# Patient Record
Sex: Male | Born: 1995 | Race: White | Hispanic: No | Marital: Single | Smoking: Current every day smoker
Health system: Southern US, Community
[De-identification: ages and names within clinical notes are randomized; demographics above are authoritative.]

---

## 2017-10-17 ENCOUNTER — Emergency Department (HOSPITAL_BASED_OUTPATIENT_CLINIC_OR_DEPARTMENT_OTHER): Payer: Self-pay

## 2017-10-17 ENCOUNTER — Encounter (HOSPITAL_BASED_OUTPATIENT_CLINIC_OR_DEPARTMENT_OTHER): Payer: Self-pay | Admitting: Emergency Medicine

## 2017-10-17 ENCOUNTER — Emergency Department (HOSPITAL_BASED_OUTPATIENT_CLINIC_OR_DEPARTMENT_OTHER)
Admission: EM | Admit: 2017-10-17 | Discharge: 2017-10-17 | Disposition: A | Discharge status: Home / Self Care | Payer: Self-pay | Attending: Emergency Medicine | Admitting: Emergency Medicine

## 2017-10-17 DIAGNOSIS — F17210 Nicotine dependence, cigarettes, uncomplicated: Secondary | ICD-10-CM | POA: Insufficient documentation

## 2017-10-17 DIAGNOSIS — L0201 Cutaneous abscess of face: Secondary | ICD-10-CM | POA: Insufficient documentation

## 2017-10-17 DIAGNOSIS — L0291 Cutaneous abscess, unspecified: Secondary | ICD-10-CM

## 2017-10-17 LAB — COMPREHENSIVE METABOLIC PANEL
ALK PHOS: 90 U/L (ref 38–126)
ALT: 47 U/L (ref 17–63)
AST: 29 U/L (ref 15–41)
Albumin: 3.7 g/dL (ref 3.5–5.0)
Anion gap: 7 (ref 5–15)
BILIRUBIN TOTAL: 0.6 mg/dL (ref 0.3–1.2)
BUN: 6 mg/dL (ref 6–20)
CALCIUM: 9 mg/dL (ref 8.9–10.3)
CO2: 30 mmol/L (ref 22–32)
Chloride: 100 mmol/L — ABNORMAL LOW (ref 101–111)
Creatinine, Ser: 1.08 mg/dL (ref 0.61–1.24)
GFR calc non Af Amer: >60 mL/min (ref >60)
Glucose, Bld: 96 mg/dL (ref 65–99)
POTASSIUM: 4 mmol/L (ref 3.5–5.1)
SODIUM: 137 mmol/L (ref 135–145)
TOTAL PROTEIN: 8 g/dL (ref 6.5–8.1)

## 2017-10-17 LAB — CBC WITH DIFFERENTIAL/PLATELET
BASOS ABS: 0 10*3/uL (ref 0.0–0.1)
BASOS PCT: 0 %
Eosinophils Absolute: 0.3 10*3/uL (ref 0.0–0.7)
Eosinophils Relative: 2 %
HCT: 44.8 % (ref 39.0–52.0)
HEMOGLOBIN: 14.9 g/dL (ref 13.0–17.0)
LYMPHS PCT: 18 %
Lymphs Abs: 2.8 10*3/uL (ref 0.7–4.0)
MCH: 31.6 pg (ref 26.0–34.0)
MCHC: 33.3 g/dL (ref 30.0–36.0)
MCV: 94.9 fL (ref 78.0–100.0)
Monocytes Absolute: 1.6 10*3/uL — ABNORMAL HIGH (ref 0.1–1.0)
Monocytes Relative: 10 %
NEUTROS ABS: 10.9 10*3/uL — AB (ref 1.7–7.7)
NEUTROS PCT: 70 %
Platelets: 345 10*3/uL (ref 150–400)
RBC: 4.72 MIL/uL (ref 4.22–5.81)
RDW: 12.9 % (ref 11.5–15.5)
WBC: 15.6 10*3/uL — ABNORMAL HIGH (ref 4.0–10.5)

## 2017-10-17 LAB — URINALYSIS, ROUTINE W REFLEX MICROSCOPIC
Bilirubin Urine: NEGATIVE
Glucose, UA: NEGATIVE mg/dL
Hgb urine dipstick: NEGATIVE
KETONES UR: NEGATIVE mg/dL
LEUKOCYTES UA: NEGATIVE
NITRITE: NEGATIVE
PH: 6 (ref 5.0–8.0)
Protein, ur: NEGATIVE mg/dL
SPECIFIC GRAVITY, URINE: 1.025 (ref 1.005–1.030)

## 2017-10-17 LAB — I-STAT CG4 LACTIC ACID, ED: Lactic Acid, Venous: 1.15 mmol/L (ref 0.5–1.9)

## 2017-10-17 MED ORDER — IBUPROFEN 800 MG PO TABS
800.0000 mg | ORAL_TABLET | Freq: Once | ORAL | Status: AC
  Administered 2017-10-17: 800 mg via ORAL
  Filled 2017-10-17: qty 2

## 2017-10-17 MED ORDER — LIDOCAINE-EPINEPHRINE (PF) 2 %-1:200000 IJ SOLN
10.0000 mL | Freq: Once | INTRAMUSCULAR | Status: AC
  Administered 2017-10-17: 10 mL via INTRADERMAL
  Filled 2017-10-17: qty 10

## 2017-10-17 MED ORDER — SULFAMETHOXAZOLE-TRIMETHOPRIM 800-160 MG PO TABS: 1.0000 | ORAL_TABLET | Freq: Two times a day (BID) | ORAL | 0 refills | Status: AC

## 2017-10-17 NOTE — ED Provider Notes (Signed)
[facility] EMERGENCY DEPARTMENT Provider Note   CSN: [id number] Arrival date & time: 10/17/17  1547     History   Chief Complaint Chief Complaint  Patient presents with  . Recurrent Skin Infections    HPI Zachary Pope is a 21 y.o. male.  HPI   [name] is a 21 year old male with a history of IV heroin use who presents to the emergency department with left forehead abscess and eye swelling.  Patient states that he noticed what appeared to be a pimple on the left forehead yesterday morning.  He picked at it and states that throughout the day it became increasingly red, swollen and tender.  This morning he woke up with left eye swelling making it difficult to open the eye, worsening throughout the day.  He states that pain in his forehead is 11/10 in severity, "throbbing and pulsating" and constant.  It is worsened with palpation.  He states that he tried cutting it open yesterday with what he states was a clean scalpel with some purulent drainage expressed.  States that he is taking ibuprofen and Benadryl to help with his symptoms.  He has also applied warm packs to the eye without significant relief.  Denies pain over the eyelid.  He also states that he has 2 abscesses on the right lower leg.  These developed over the past 3 days.  States that he used a scalpel to open one of them and was able to express significant purulent drainage.  He denies fever, nausea/vomiting, visual disturbance, pain with moving the eye, headache, ear pain, neck pain, numbness/weakness.  States that he has never had problems with abscesses in the past.  States that he has used heroin since age 56 and gets clean needles from the needle exchange program weekly.   History reviewed. No pertinent past medical history.  There are no active problems to display for this patient.   History reviewed. No pertinent surgical history.     Home Medications    Prior to Admission medications   Medication  Sig Start Date End Date Taking? Authorizing Provider  sulfamethoxazole-trimethoprim (BACTRIM DS,SEPTRA DS) 800-160 MG tablet Take 1 tablet by mouth 2 (two) times daily. 10/17/17   Kellie Shropshire, PA-C    Family History No family history on file.  Social History Social History  Substance Use Topics  . Smoking status: Current Every Day Smoker  . Smokeless tobacco: Never Used  . Alcohol use Yes     Allergies   Patient has no known allergies.   Review of Systems Review of Systems  Constitutional: Negative for chills, fatigue and fever.  HENT: Positive for facial swelling. Negative for sinus pain and sinus pressure.   Eyes: Negative for pain, discharge and visual disturbance.  Respiratory: Negative for shortness of breath.   Cardiovascular: Negative for chest pain.  Gastrointestinal: Negative for abdominal pain, diarrhea, nausea and vomiting.  Genitourinary: Negative for difficulty urinating.  Musculoskeletal: Negative for back pain.  Skin: Positive for color change (red and swollen left forehead ).  Neurological: Negative for dizziness, weakness, light-headedness, numbness and headaches.  Psychiatric/Behavioral: Negative for agitation.     Physical Exam Updated Vital Signs BP (!) 128/91   Pulse 85   Temp 98.5 F (36.9 C) (Oral)   Resp 19   Ht 5\' 9"  (1.753 m)   Wt 68 kg (150 lb)   SpO2 100%   BMI 22.15 kg/m   Physical Exam  Constitutional: He is oriented to person, place,  and time. He appears well-developed and well-nourished. No distress.  HENT:  Head: Normocephalic and atraumatic.  Mouth/Throat: Oropharynx is clear and moist. No oropharyngeal exudate.  Approximately 2 cm raised area of erythema, swelling and tenderness on the left forehead.  There is mild purulent drainage leaking.  It is tender to palpation.  Area of induration extending to the left eyebrow which is tender  Significant eyelid swelling of the left eye.  No tenderness of the eyelid to palpation.   See picture below.  Eyes: Pupils are equal, round, and reactive to light. Conjunctivae and EOM are normal. Right eye exhibits no discharge. Left eye exhibits no discharge.  Denies pain with EOM. Unable to fully open the left eye due to eyelid swelling.   Neck: Normal range of motion. Neck supple.  Cardiovascular: Normal rate, regular rhythm and intact distal pulses.  Exam reveals no friction rub.   No murmur heard. Pulmonary/Chest: Effort normal and breath sounds normal. No respiratory distress. He has no wheezes. He has no rales.  Abdominal: Soft. Bowel sounds are normal. There is no tenderness. There is no guarding.  Musculoskeletal: Normal range of motion.  Two areas of erythema noted on the right lateral lower leg. These are tender to the touch, erythematous.  There is induration, but no palpable raised area.  Some scabbing noted. See picture below.  DP pulses 2+ bilaterally.  Lymphadenopathy:    He has no cervical adenopathy.  Neurological: He is alert and oriented to person, place, and time. Coordination normal.  Sensation to light touch intact in bilateral distal lower extremities.  Skin: Skin is warm and dry. Capillary refill takes less than 2 seconds. He is not diaphoretic.  Psychiatric: He has a normal mood and affect. His behavior is normal.  Nursing note and vitals reviewed.         ED Treatments / Results  Labs (all labs ordered are listed, but only abnormal results are displayed) Labs Reviewed  COMPREHENSIVE METABOLIC PANEL - Abnormal; Notable for the following:       Result Value   Chloride 100 (*)    All other components within normal limits  CBC WITH DIFFERENTIAL/PLATELET - Abnormal; Notable for the following:    WBC 15.6 (*)    Neutro Abs 10.9 (*)    Monocytes Absolute 1.6 (*)    All other components within normal limits  URINALYSIS, ROUTINE W REFLEX MICROSCOPIC  I-STAT CG4 LACTIC ACID, ED  I-STAT CG4 LACTIC ACID, ED    EKG  EKG Interpretation None         Radiology No results found.  EMERGENCY DEPARTMENT US SOFT TISSUE INTERPRETATION "Study: Limited Soft Tissue Ultrasound"  INDICATIONS: Soft tissue infection Multiple views of the body part were obtained in real-time with a multi-frequency linear probe  PERFORMED BY: Myself IMAGES ARCHIVED?: Yes SIDE:Right  BODY PART:Lower extremity INTERPRETATION:  No abcess noted and Cellulitis present     Procedures .Marland Kitchen.Incision and Drainage Date/Time: 10/18/2017 1:30 AM Performed by: Kellie ShropshireSHROSBREE, Toshiyuki Fredell J Authorized by: Kellie ShropshireSHROSBREE, Furkan Keenum J   Consent:    Consent obtained:  Verbal and emergent situation   Consent given by:  Patient   Risks discussed:  Bleeding, incomplete drainage, infection and pain   Alternatives discussed:  No treatment and delayed treatment Location:    Type:  Abscess   Size:  2cm   Location:  Head   Head/neck location: left forehead. Pre-procedure details:    Skin preparation:  Betadine Anesthesia (see MAR for exact dosages):  Anesthesia method:  Local infiltration   Local anesthetic:  Lidocaine 2% WITH epi Procedure type:    Complexity:  Simple Procedure details:    Incision types:  Single straight   Scalpel blade:  11   Wound management:  Probed and deloculated   Drainage:  Purulent and bloody   Drainage amount:  Moderate   Wound treatment:  Wound left open   Packing materials:  None Post-procedure details:    Patient tolerance of procedure:  Tolerated well, no immediate complications   (including critical care time)  Medications Ordered in ED Medications  ibuprofen (ADVIL,MOTRIN) tablet 800 mg (800 mg Oral Given 10/17/17 1925)  lidocaine-EPINEPHrine (XYLOCAINE W/EPI) 2 %-1:200000 (PF) injection 10 mL (10 mLs Intradermal Given by Other 10/17/17 1936)     Initial Impression / Assessment and Plan / ED Course  I have reviewed the triage vital signs and the nursing notes.  Pertinent labs & imaging results that were available during my care of the  patient were reviewed by me and considered in my medical decision making (see chart for details).    Patient with a history of IV heroin use presents to the ER with left forehead pain, swelling and erythema for the past day. Also has non-tender eye swelling. Basic labs ordered. He has leukocytosis with WBC 15.6. No lactic acidosis, CMP unremarkable. He is afebrile and non-toxic appearing.   Skin abscess of the left forehead amenable to incision and drainage.  Abscess was not large enough to warrant packing or drain,  wound recheck in 2 days. Encouraged home warm soaks and flushing. No tenderness over the left eyelid, erythema or warmth. No tenderness with EOM to suggest orbital cellulitis. No visual disturbance or red eye.   He does have two areas of redness and induration on the right lower leg. US performed in the room does not show underlying abscess. Will send home with bactrim antibiotic. Patient is afebrile and non-toxic appearing. Have discussed strict return precautions including development of a fever, worsening pain/redness and swelling despite antibiotics, trouble with vision or any new or worsening symptoms. Discussed this patient with Dr. Anitra Lauth who also saw the patient and agrees with discharge.    Final Clinical Impressions(s) / ED Diagnoses   Final diagnoses:  Abscess    New Prescriptions New Prescriptions   SULFAMETHOXAZOLE-TRIMETHOPRIM (BACTRIM DS,SEPTRA DS) 800-160 MG TABLET    Take 1 tablet by mouth 2 (two) times daily.     Kellie Shropshire, PA-C 10/18/17 1206    Gwyneth Sprout, MD 10/18/17 Corky Crafts

## 2017-10-17 NOTE — Discharge Instructions (Signed)
Please fill your prescription for antibiotic called Bactrim and take this twice a day for the next 7 days.  Please gently use warm soapy water to clean the wound daily.  Return to the ER or urgent care for wound recheck in 2 days.  Return to the emergency department if you develop fever, worsening swelling/redness/pain despite taking antibiotics, if you have pain opening or moving the eyes.  Please also return if you have any new or worsening symptoms.

## 2017-10-17 NOTE — ED Notes (Signed)
Pt on cardiac monitor and auto VS 

## 2017-10-17 NOTE — ED Notes (Signed)
Pt states he is an IV drug user.

## 2017-10-17 NOTE — ED Triage Notes (Signed)
Swelling and redness noted around L eye and pt also reports several abscesses to R leg.

## 2020-05-05 ENCOUNTER — Emergency Department (HOSPITAL_COMMUNITY)
Admission: EM | Admit: 2020-05-05 | Discharge: 2020-05-05 | Disposition: A | Discharge status: Home / Self Care | Payer: No Typology Code available for payment source | Attending: Emergency Medicine | Admitting: Emergency Medicine

## 2020-05-05 ENCOUNTER — Other Ambulatory Visit: Payer: Self-pay

## 2020-05-05 ENCOUNTER — Emergency Department (HOSPITAL_COMMUNITY): Payer: No Typology Code available for payment source

## 2020-05-05 DIAGNOSIS — Y93I9 Activity, other involving external motion: Secondary | ICD-10-CM | POA: Insufficient documentation

## 2020-05-05 DIAGNOSIS — Y92410 Unspecified street and highway as the place of occurrence of the external cause: Secondary | ICD-10-CM | POA: Diagnosis not present

## 2020-05-05 DIAGNOSIS — R0789 Other chest pain: Secondary | ICD-10-CM | POA: Insufficient documentation

## 2020-05-05 DIAGNOSIS — S93401A Sprain of unspecified ligament of right ankle, initial encounter: Secondary | ICD-10-CM | POA: Diagnosis not present

## 2020-05-05 DIAGNOSIS — R109 Unspecified abdominal pain: Secondary | ICD-10-CM | POA: Insufficient documentation

## 2020-05-05 DIAGNOSIS — S0181XA Laceration without foreign body of other part of head, initial encounter: Secondary | ICD-10-CM

## 2020-05-05 DIAGNOSIS — F17210 Nicotine dependence, cigarettes, uncomplicated: Secondary | ICD-10-CM | POA: Insufficient documentation

## 2020-05-05 DIAGNOSIS — R4182 Altered mental status, unspecified: Secondary | ICD-10-CM | POA: Diagnosis not present

## 2020-05-05 DIAGNOSIS — Z79899 Other long term (current) drug therapy: Secondary | ICD-10-CM | POA: Diagnosis not present

## 2020-05-05 DIAGNOSIS — Y999 Unspecified external cause status: Secondary | ICD-10-CM | POA: Insufficient documentation

## 2020-05-05 DIAGNOSIS — S92301A Fracture of unspecified metatarsal bone(s), right foot, initial encounter for closed fracture: Secondary | ICD-10-CM | POA: Diagnosis not present

## 2020-05-05 LAB — I-STAT CHEM 8, ED
BUN: 11 mg/dL (ref 6–20)
Calcium, Ion: 1.28 mmol/L (ref 1.15–1.40)
Chloride: 99 mmol/L (ref 98–111)
Creatinine, Ser: 1.1 mg/dL (ref 0.61–1.24)
Glucose, Bld: 88 mg/dL (ref 70–99)
HCT: 41 % (ref 39.0–52.0)
Hemoglobin: 13.9 g/dL (ref 13.0–17.0)
Potassium: 3.1 mmol/L — ABNORMAL LOW (ref 3.5–5.1)
Sodium: 140 mmol/L (ref 135–145)
TCO2: 32 mmol/L (ref 22–32)

## 2020-05-05 LAB — RAPID URINE DRUG SCREEN, HOSP PERFORMED
Amphetamines: POSITIVE — AB
Barbiturates: NOT DETECTED
Benzodiazepines: NOT DETECTED
Cocaine: NOT DETECTED
Opiates: NOT DETECTED
Tetrahydrocannabinol: POSITIVE — AB

## 2020-05-05 LAB — COMPREHENSIVE METABOLIC PANEL
ALT: 272 U/L — ABNORMAL HIGH (ref 0–44)
AST: 119 U/L — ABNORMAL HIGH (ref 15–41)
Albumin: 3.7 g/dL (ref 3.5–5.0)
Alkaline Phosphatase: 58 U/L (ref 38–126)
Anion gap: 8 (ref 5–15)
BUN: 10 mg/dL (ref 6–20)
CO2: 29 mmol/L (ref 22–32)
Calcium: 9.2 mg/dL (ref 8.9–10.3)
Chloride: 102 mmol/L (ref 98–111)
Creatinine, Ser: 1.14 mg/dL (ref 0.61–1.24)
GFR calc Af Amer: >60 mL/min (ref >60)
GFR calc non Af Amer: >60 mL/min (ref >60)
Glucose, Bld: 100 mg/dL — ABNORMAL HIGH (ref 70–99)
Potassium: 3.1 mmol/L — ABNORMAL LOW (ref 3.5–5.1)
Sodium: 139 mmol/L (ref 135–145)
Total Bilirubin: 0.4 mg/dL (ref 0.3–1.2)
Total Protein: 6.5 g/dL (ref 6.5–8.1)

## 2020-05-05 LAB — URINALYSIS, ROUTINE W REFLEX MICROSCOPIC
Bacteria, UA: NONE SEEN
Bilirubin Urine: NEGATIVE
Glucose, UA: NEGATIVE mg/dL
Ketones, ur: NEGATIVE mg/dL
Leukocytes,Ua: NEGATIVE
Nitrite: NEGATIVE
Protein, ur: NEGATIVE mg/dL
Specific Gravity, Urine: 1.011 (ref 1.005–1.030)
pH: 6 (ref 5.0–8.0)

## 2020-05-05 LAB — CBC
HCT: 42.6 % (ref 39.0–52.0)
Hemoglobin: 14.4 g/dL (ref 13.0–17.0)
MCH: 32.1 pg (ref 26.0–34.0)
MCHC: 33.8 g/dL (ref 30.0–36.0)
MCV: 95.1 fL (ref 80.0–100.0)
Platelets: 248 10*3/uL (ref 150–400)
RBC: 4.48 MIL/uL (ref 4.22–5.81)
RDW: 12.5 % (ref 11.5–15.5)
WBC: 6.9 10*3/uL (ref 4.0–10.5)
nRBC: 0 % (ref 0.0–0.2)

## 2020-05-05 LAB — PROTIME-INR
INR: 1 (ref 0.8–1.2)
Prothrombin Time: 12.7 seconds (ref 11.4–15.2)

## 2020-05-05 LAB — SAMPLE TO BLOOD BANK

## 2020-05-05 LAB — LACTIC ACID, PLASMA: Lactic Acid, Venous: 1.5 mmol/L (ref 0.5–1.9)

## 2020-05-05 LAB — ETHANOL: Alcohol, Ethyl (B): <10 mg/dL (ref <10)

## 2020-05-05 MED ORDER — LIDOCAINE-EPINEPHRINE (PF) 2 %-1:200000 IJ SOLN
20.0000 mL | Freq: Once | INTRAMUSCULAR | Status: AC
  Administered 2020-05-05: 20 mL
  Filled 2020-05-05: qty 20

## 2020-05-05 MED ORDER — TETANUS-DIPHTH-ACELL PERTUSSIS 5-2.5-18.5 LF-MCG/0.5 IM SUSP: 0.5000 mL | Freq: Once | INTRAMUSCULAR | Status: DC

## 2020-05-05 MED ORDER — IOHEXOL 300 MG/ML  SOLN
100.0000 mL | Freq: Once | INTRAMUSCULAR | Status: AC | PRN
  Administered 2020-05-05: 100 mL via INTRAVENOUS

## 2020-05-05 NOTE — ED Provider Notes (Signed)
[facility] EMERGENCY DEPARTMENT Provider Note      Procedures .Marland KitchenLaceration Repair  Date/Time: 05/05/2020 4:01 AM Performed by: Roxy Horseman, PA-C Authorized by: Roxy Horseman, PA-C   Consent:    Consent obtained:  Verbal   Consent given by:  Patient   Risks discussed:  Infection, need for additional repair, pain, poor cosmetic result and poor wound healing   Alternatives discussed:  No treatment and delayed treatment Universal protocol:    Procedure explained and questions answered to patient or proxy's satisfaction: yes     Relevant documents present and verified: yes     Test results available and properly labeled: yes     Imaging studies available: yes     Required blood products, implants, devices, and special equipment available: yes     Site/side marked: yes     Immediately prior to procedure, a time out was called: yes     Patient identity confirmed:  Verbally with patient Anesthesia (see MAR for exact dosages):    Anesthesia method:  Local infiltration   Local anesthetic:  Lidocaine 1% WITH epi Laceration details:    Location:  Face   Face location:  L eyebrow   Length (cm):  3 Repair type:    Repair type:  Simple Pre-procedure details:    Preparation:  Patient was prepped and draped in usual sterile fashion Exploration:    Hemostasis achieved with:  Direct pressure   Wound exploration: wound explored through full range of motion and entire depth of wound probed and visualized     Wound extent: no fascia violation noted, no muscle damage noted and no vascular damage noted     Contaminated: no   Treatment:    Area cleansed with:  Saline   Amount of cleaning:  Standard   Irrigation solution:  Sterile saline Skin repair:    Repair method:  Sutures   Suture size:  5-0   Suture material:  Prolene   Suture technique:  Simple interrupted   Number of sutures:  11 Approximation:    Approximation:  Close Post-procedure details:   Dressing:  Open (no dressing)   Patient tolerance of procedure:  Tolerated well, no immediate complications .Marland KitchenLaceration Repair  Date/Time: 05/05/2020 4:02 AM Performed by: Roxy Horseman, PA-C Authorized by: Roxy Horseman, PA-C   Consent:    Consent obtained:  Verbal   Consent given by:  Patient   Risks discussed:  Infection, need for additional repair, pain, poor cosmetic result and poor wound healing   Alternatives discussed:  No treatment and delayed treatment Universal protocol:    Procedure explained and questions answered to patient or proxy's satisfaction: yes     Relevant documents present and verified: yes     Test results available and properly labeled: yes     Imaging studies available: yes     Required blood products, implants, devices, and special equipment available: yes     Site/side marked: yes     Immediately prior to procedure, a time out was called: yes     Patient identity confirmed:  Verbally with patient Anesthesia (see MAR for exact dosages):    Anesthesia method:  Local infiltration   Local anesthetic:  Lidocaine 1% WITH epi Laceration details:    Location:  Face   Face location:  L upper eyelid   Extent:  Superficial   Length (cm):  2 Repair type:    Repair type:  Simple Pre-procedure details:    Preparation:  Patient was prepped  and draped in usual sterile fashion Exploration:    Hemostasis achieved with:  Direct pressure   Wound exploration: wound explored through full range of motion     Contaminated: no   Treatment:    Area cleansed with:  Saline   Amount of cleaning:  Standard   Irrigation solution:  Sterile saline Skin repair:    Repair method:  Sutures   Suture technique:  Running   Number of sutures:  7 Approximation:    Approximation:  Close Post-procedure details:    Dressing:  Open (no dressing)       Montine Circle, PA-C 05/05/20 0404    Ripley Fraise, MD 05/05/20 0500
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
## 2020-05-05 NOTE — Progress Notes (Signed)
Orthopedic Tech Progress Note Patient Details:  Zachary Pope [birth year] [id number]  Ortho Devices Type of Ortho Device: Crutches, Pope (short leg) splint Ortho Device/Splint Location: rle Ortho Device/Splint Interventions: Ordered, Application, Adjustment   Pope Interventions Patient Tolerated: Well Instructions Provided: Care of device, Adjustment of device   Zachary Pope 05/05/2020, 3:59 AM

## 2020-05-10 ENCOUNTER — Emergency Department (HOSPITAL_COMMUNITY)
Admission: EM | Admit: 2020-05-10 | Discharge: 2020-05-10 | Disposition: A | Discharge status: Home / Self Care | Payer: Self-pay | Attending: Emergency Medicine | Admitting: Emergency Medicine

## 2020-05-10 ENCOUNTER — Encounter (HOSPITAL_COMMUNITY): Payer: Self-pay | Admitting: Emergency Medicine

## 2020-05-10 DIAGNOSIS — F17200 Nicotine dependence, unspecified, uncomplicated: Secondary | ICD-10-CM | POA: Insufficient documentation

## 2020-05-10 DIAGNOSIS — Z4802 Encounter for removal of sutures: Secondary | ICD-10-CM | POA: Insufficient documentation

## 2020-05-10 NOTE — ED Notes (Signed)
Patient given discharge instructions. Questions were answered. Patient verbalized understanding of discharge instructions and care at home.  
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
## 2020-05-10 NOTE — Discharge Instructions (Signed)
There is normal to have a small amount of oozing after your staples or sutures are taken out.  Please be very gentle with your wound as it can still reopen.  Please put antibiotic ointment on your wound at least twice a day for the next week.   Please call medical records during the week to ask about getting your records sent as you need for your orthopedist.
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
